# Patient Record
Sex: Male | Born: 1980 | Race: Black or African American | Hispanic: No | Marital: Married | Smoking: Former smoker
Health system: Southern US, Community
[De-identification: ages and names within clinical notes are randomized; demographics above are authoritative.]

## PROBLEM LIST (undated history)

## (undated) DIAGNOSIS — I10 Essential (primary) hypertension: Secondary | ICD-10-CM

---

## 2003-03-14 DIAGNOSIS — I10 Essential (primary) hypertension: Secondary | ICD-10-CM | POA: Insufficient documentation

## 2005-02-22 ENCOUNTER — Emergency Department (HOSPITAL_COMMUNITY): Admission: EM | Admit: 2005-02-22 | Discharge: 2005-02-22 | Payer: Self-pay | Admitting: Emergency Medicine

## 2008-08-12 ENCOUNTER — Emergency Department (HOSPITAL_COMMUNITY): Admission: EM | Admit: 2008-08-12 | Discharge: 2008-08-12 | Payer: Self-pay | Admitting: Emergency Medicine

## 2017-09-27 ENCOUNTER — Ambulatory Visit
Admission: RE | Admit: 2017-09-27 | Discharge: 2017-09-27 | Disposition: A | Discharge status: Home / Self Care | Payer: No Typology Code available for payment source | Source: Ambulatory Visit | Attending: Occupational Medicine | Admitting: Occupational Medicine

## 2017-09-27 ENCOUNTER — Other Ambulatory Visit: Payer: Self-pay | Admitting: Occupational Medicine

## 2017-09-27 DIAGNOSIS — Z021 Encounter for pre-employment examination: Secondary | ICD-10-CM

## 2018-06-14 IMAGING — CR DG CHEST 1V
1 series · 1 of 1 positions shown · non-contrast
Comparison: None.

CLINICAL DATA: 36-year-old male pre employment study.

EXAM:
CHEST 1 VIEW

[w chest pa]
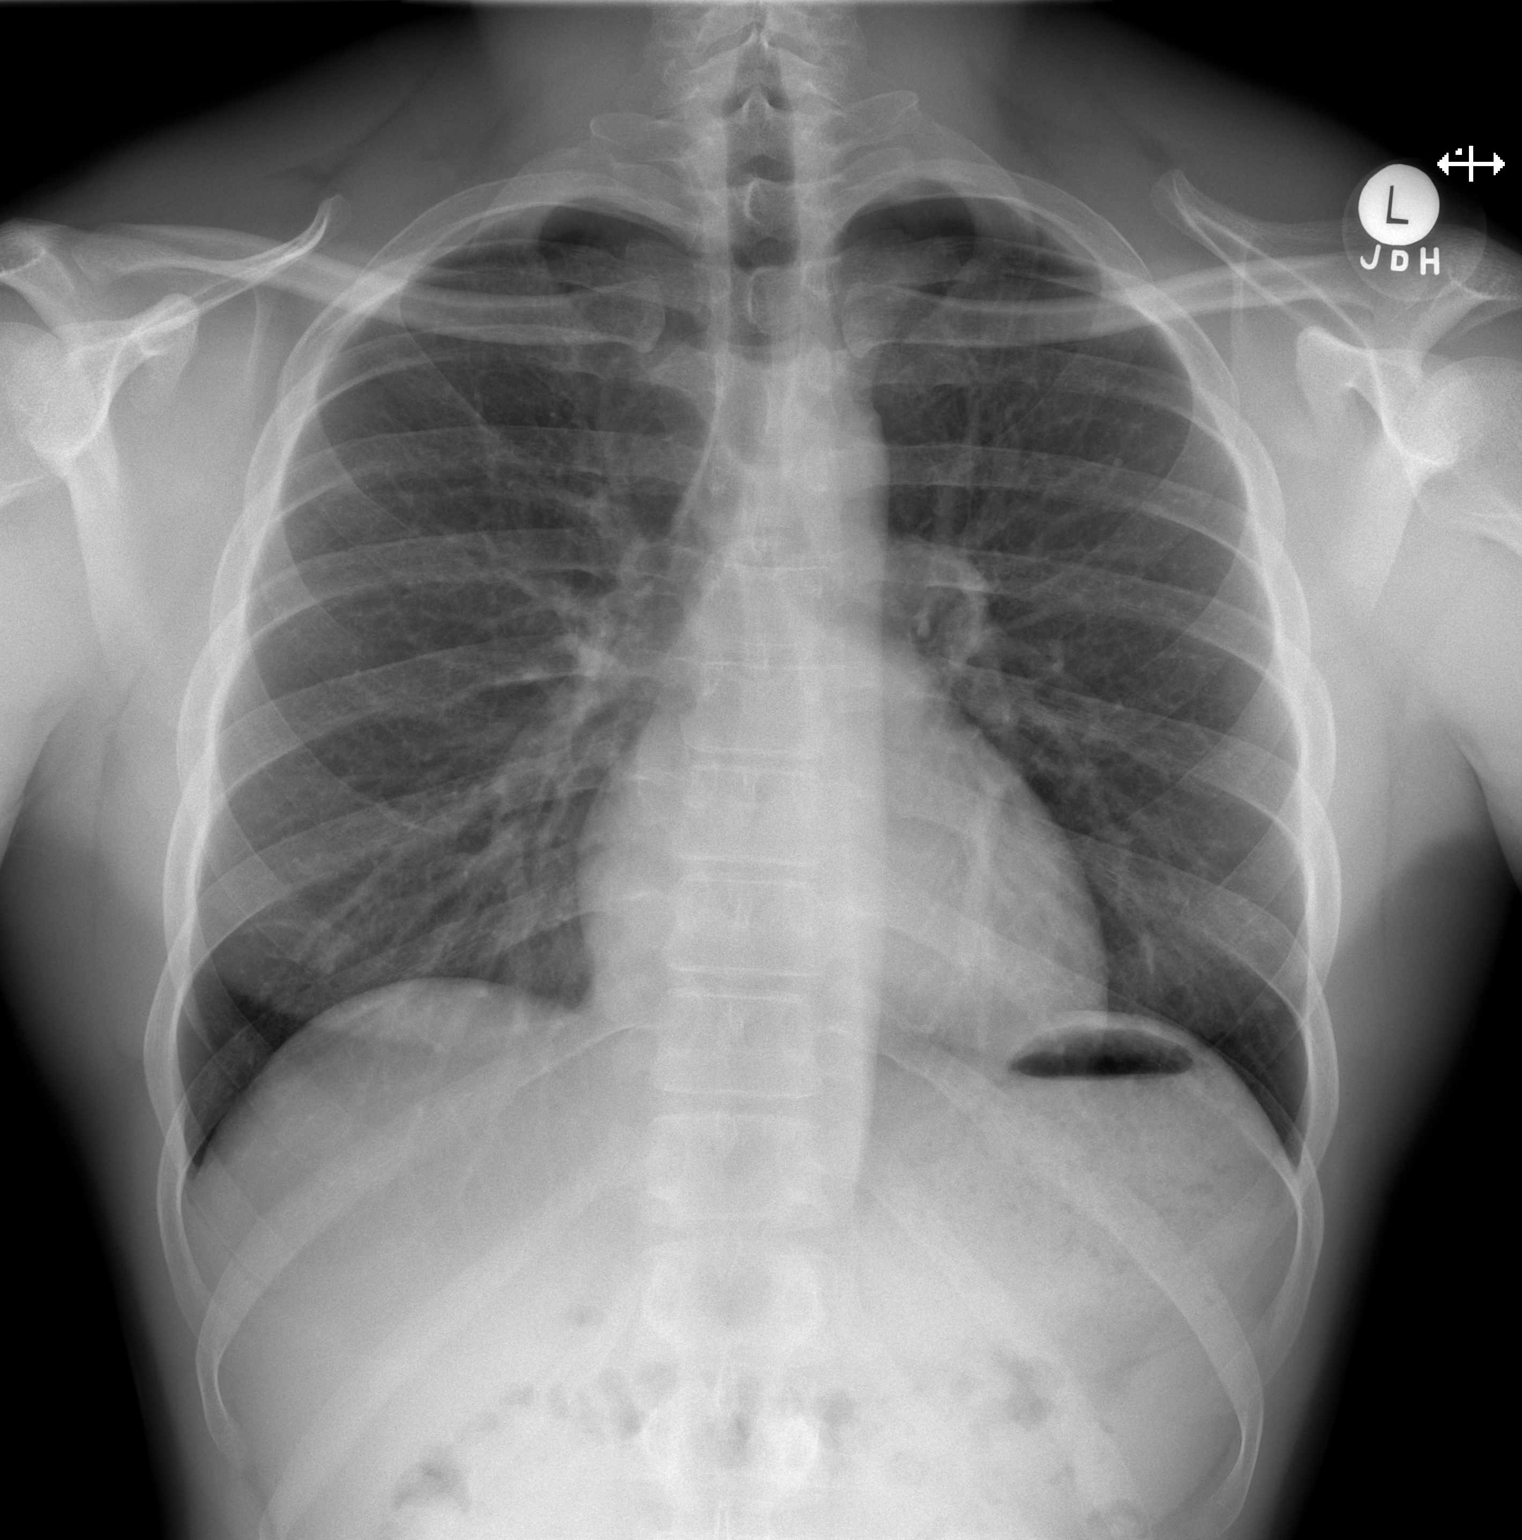

[1 of 1 positions shown; findings below may reference images not displayed]

FINDINGS: The heart size and mediastinal contours are within normal limits.
Both lungs are clear. The visualized skeletal structures are
unremarkable. Negative visible bowel gas pattern.
IMPRESSION: Negative.  No acute cardiopulmonary abnormality.

## 2019-06-05 ENCOUNTER — Emergency Department (HOSPITAL_COMMUNITY)
Admission: EM | Admit: 2019-06-05 | Discharge: 2019-06-05 | Disposition: A | Discharge status: Home / Self Care | Payer: Worker's Compensation | Attending: Emergency Medicine | Admitting: Emergency Medicine

## 2019-06-05 ENCOUNTER — Other Ambulatory Visit: Payer: Self-pay

## 2019-06-05 ENCOUNTER — Encounter (HOSPITAL_COMMUNITY): Payer: Self-pay

## 2019-06-05 DIAGNOSIS — Z5321 Procedure and treatment not carried out due to patient leaving prior to being seen by health care provider: Secondary | ICD-10-CM | POA: Insufficient documentation

## 2019-06-05 DIAGNOSIS — Z0000 Encounter for general adult medical examination without abnormal findings: Secondary | ICD-10-CM | POA: Diagnosis present

## 2019-06-05 HISTORY — DX: Essential (primary) hypertension: I10
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
## 2022-03-07 ENCOUNTER — Other Ambulatory Visit: Payer: Self-pay

## 2022-03-07 ENCOUNTER — Emergency Department (HOSPITAL_BASED_OUTPATIENT_CLINIC_OR_DEPARTMENT_OTHER)
Admission: EM | Admit: 2022-03-07 | Discharge: 2022-03-07 | Disposition: A | Discharge status: Home / Self Care | Payer: No Typology Code available for payment source | Attending: Emergency Medicine | Admitting: Emergency Medicine

## 2022-03-07 ENCOUNTER — Encounter (HOSPITAL_BASED_OUTPATIENT_CLINIC_OR_DEPARTMENT_OTHER): Payer: Self-pay

## 2022-03-07 DIAGNOSIS — W460XXA Contact with hypodermic needle, initial encounter: Secondary | ICD-10-CM

## 2022-03-07 DIAGNOSIS — W461XXA Contact with contaminated hypodermic needle, initial encounter: Secondary | ICD-10-CM | POA: Insufficient documentation

## 2022-03-07 DIAGNOSIS — Z7721 Contact with and (suspected) exposure to potentially hazardous body fluids: Secondary | ICD-10-CM | POA: Diagnosis present

## 2022-03-07 DIAGNOSIS — Z21 Asymptomatic human immunodeficiency virus [HIV] infection status: Secondary | ICD-10-CM | POA: Insufficient documentation

## 2022-03-07 LAB — HM HIV SCREENING LAB: HM HIV Screening: NEGATIVE

## 2022-03-07 LAB — RAPID HIV SCREEN (HIV 1/2 AB+AG)
HIV 1/2 Antibodies: NONREACTIVE
HIV-1 P24 Antigen - HIV24: NONREACTIVE

## 2022-03-07 MED ORDER — ELVITEG-COBIC-EMTRICIT-TENOFAF 150-150-200-10 MG PO TABS: 1.0000 | ORAL_TABLET | Freq: Every day | ORAL | 0 refills | Status: DC

## 2022-03-07 MED ORDER — ELVITEG-COBIC-EMTRICIT-TENOFAF 150-150-200-10 MG PREPACK
1.0000 | ORAL_TABLET | Freq: Once | ORAL | Status: AC
  Administered 2022-03-07: 1 via ORAL
  Filled 2022-03-07: qty 1
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
## 2022-03-07 NOTE — Discharge Instructions (Signed)
Please take the HIV medications were given to his prophylaxis as directed, beginning tomorrow, until you have completed the full course.  You should follow-up on your medical records online for the results of your HIV and hepatitis test.
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
## 2022-03-08 LAB — HEPATITIS PANEL, ACUTE
HCV Ab: NONREACTIVE
Hep A IgM: NONREACTIVE
Hep B C IgM: NONREACTIVE
Hepatitis B Surface Ag: NONREACTIVE

## 2022-03-08 LAB — HEPATITIS B SURFACE ANTIGEN: Hepatitis B Surface Ag: NONREACTIVE

## 2022-03-09 LAB — RPR: RPR Ser Ql: NONREACTIVE

## 2022-09-15 ENCOUNTER — Ambulatory Visit: Payer: 59 | Admitting: Internal Medicine

## 2022-09-15 ENCOUNTER — Encounter: Payer: Self-pay | Admitting: Internal Medicine

## 2022-09-15 VITALS — BP 136/72 | HR 103 | Temp 98.8°F | Resp 18 | Ht 72.0 in | Wt 187.8 lb

## 2022-09-15 DIAGNOSIS — Z0000 Encounter for general adult medical examination without abnormal findings: Secondary | ICD-10-CM

## 2022-09-15 DIAGNOSIS — R7303 Prediabetes: Secondary | ICD-10-CM | POA: Diagnosis not present

## 2022-09-15 DIAGNOSIS — Z13220 Encounter for screening for lipoid disorders: Secondary | ICD-10-CM

## 2022-09-15 NOTE — Patient Instructions (Signed)
It was great seeing you today!  Plan discussed at today's visit: -Blood work ordered today, results will be uploaded to MyChart.   Follow up in: 1 year or sooner as needed  Take care and let us know if you have any questions or concerns prior to your next visit.  Dr. Aryan Sparks  

## 2022-09-15 NOTE — Progress Notes (Signed)
New Patient Office Visit  Subjective    Patient ID: Nathan Meyers, male    DOB: Apr 09, 1981  Age: 41 y.o. MRN: [id number]  CC:  Chief Complaint  Patient presents with   Establish Care    HPI HERCHEL HOPKIN presents to establish care. He is also seen through the [place].   Hypertension: -Medications: Lisinopril-HCTZ 20-25 mg, been on for several years, managed by [place].  -Patient is compliant with above medications and reports no side effects. -Checking BP at home (average): 114/80 at home  -Denies any SOB, CP, vision changes, LE edema or symptoms of hypotension  Pre-Diabetes  -A1c in 2021 5.7%, 8/22 6.0% -Never been on medication but took a health and nutrition class and lost 10-15 pounds since his last A1c  Health Maintenance: -Blood work due -Politely declines flu vaccine  Outpatient Encounter Medications as of 09/15/2022  Medication Sig   lisinopril-hydrochlorothiazide (ZESTORETIC) 20-25 MG tablet Take 1 tablet by mouth daily.   [DISCONTINUED] elvitegravir-cobicistat-emtricitabine-tenofovir (GENVOYA) 150-150-200-10 MG TABS tablet Take 1 tablet by mouth daily with breakfast.   [DISCONTINUED] elvitegravir-cobicistat-emtricitabine-tenofovir (GENVOYA) 150-150-200-10 MG TABS tablet Take 1 tablet by mouth daily with breakfast.   No facility-administered encounter medications on file as of 09/15/2022.    Past Medical History:  Diagnosis Date   Hypertension     History reviewed. No pertinent surgical history.  Family History  Problem Relation Age of Onset   Diabetes Mother    Hypertension Mother    Heart disease Father    Hypertension Father     Social History   Socioeconomic History   Marital status: Married    Spouse name: Not on file   Number of children: Not on file   Years of education: Not on file   Highest education level: Not on file  Occupational History   Not on file  Tobacco Use   Smoking status: Former    Packs/day: 0.50    Years: 3.00    Total  pack years: 1.50    Types: Cigarettes    Quit date: 01/25/2014    Years since quitting: 8.6   Smokeless tobacco: Never  Vaping Use   Vaping Use: Never used  Substance and Sexual Activity   Alcohol use: Yes    Alcohol/week: 4.0 standard drinks of alcohol    Types: 4 Cans of beer per week   Drug use: Never   Sexual activity: Yes    Birth control/protection: None  Other Topics Concern   Not on file  Social History Narrative   Not on file   Social Determinants of Health   Financial Resource Strain: Not on file  Food Insecurity: Not on file  Transportation Needs: Not on file  Physical Activity: Not on file  Stress: Not on file  Social Connections: Not on file  Intimate Partner Violence: Not on file    Review of Systems  Constitutional:  Negative for chills and fever.  Eyes:  Negative for blurred vision.  Respiratory:  Negative for shortness of breath.   Cardiovascular:  Negative for chest pain.  Neurological:  Negative for headaches.      Objective    BP 136/72   Pulse (!) 103   Temp 98.8 F (37.1 C)   Resp 18   Ht 6' (1.829 m)   Wt 187 lb 12.8 oz (85.2 kg)   SpO2 96%   BMI 25.47 kg/m   Physical Exam Constitutional:      Appearance: Normal appearance.  HENT:  Head: Normocephalic and atraumatic.     Mouth/Throat:     Mouth: Mucous membranes are moist.     Pharynx: Oropharynx is clear.  Eyes:     Extraocular Movements: Extraocular movements intact.     Conjunctiva/sclera: Conjunctivae normal.     Pupils: Pupils are equal, round, and reactive to light.  Cardiovascular:     Rate and Rhythm: Normal rate and regular rhythm.  Pulmonary:     Effort: Pulmonary effort is normal.     Breath sounds: Normal breath sounds.  Musculoskeletal:     Right lower leg: No edema.     Left lower leg: No edema.  Lymphadenopathy:     Cervical: No cervical adenopathy.  Skin:    General: Skin is warm and dry.  Neurological:     General: No focal deficit present.      Mental Status: He is alert. Mental status is at baseline.  Psychiatric:        Mood and Affect: Mood normal.        Behavior: Behavior normal.         Assessment & Plan:   1. Annual physical exam/Lipid screening/Prediabetes: Due for annual labs. Follow up in 1 year or sooner as needed.   - CBC w/Diff/Platelet - COMPLETE METABOLIC PANEL WITH GFR - HgB R6E - Lipid Profile   Return in about 1 year (around 09/16/2023).   Margarita Mail, DO

## 2022-09-16 LAB — CBC WITH DIFFERENTIAL/PLATELET
Absolute Monocytes: 440 cells/uL (ref 200–950)
Basophils Absolute: 40 cells/uL (ref 0–200)
Basophils Relative: 1 %
Eosinophils Absolute: 268 cells/uL (ref 15–500)
Eosinophils Relative: 6.7 %
HCT: 41.6 % (ref 38.5–50.0)
Hemoglobin: 14.4 g/dL (ref 13.2–17.1)
Lymphs Abs: 1388 cells/uL (ref 850–3900)
MCH: 30.8 pg (ref 27.0–33.0)
MCHC: 34.6 g/dL (ref 32.0–36.0)
MCV: 89.1 fL (ref 80.0–100.0)
MPV: 10.5 fL (ref 7.5–12.5)
Monocytes Relative: 11 %
Neutro Abs: 1864 cells/uL (ref 1500–7800)
Neutrophils Relative %: 46.6 %
Platelets: 216 10*3/uL (ref 140–400)
RBC: 4.67 10*6/uL (ref 4.20–5.80)
RDW: 12.6 % (ref 11.0–15.0)
Total Lymphocyte: 34.7 %
WBC: 4 10*3/uL (ref 3.8–10.8)

## 2022-09-16 LAB — HEMOGLOBIN A1C
Hgb A1c MFr Bld: 5.7 % of total Hgb — ABNORMAL HIGH (ref <5.7)
Mean Plasma Glucose: 117 mg/dL
eAG (mmol/L): 6.5 mmol/L

## 2022-09-16 LAB — LIPID PANEL
Cholesterol: 244 mg/dL — ABNORMAL HIGH (ref <200)
HDL: 60 mg/dL (ref >=40)
LDL Cholesterol (Calc): 163 mg/dL (calc) — ABNORMAL HIGH
Non-HDL Cholesterol (Calc): 184 mg/dL (calc) — ABNORMAL HIGH (ref <130)
Total CHOL/HDL Ratio: 4.1 (calc) (ref <5.0)
Triglycerides: 100 mg/dL (ref <150)

## 2022-09-16 LAB — COMPLETE METABOLIC PANEL WITH GFR
AG Ratio: 1.6 (calc) (ref 1.0–2.5)
ALT: 20 U/L (ref 9–46)
AST: 18 U/L (ref 10–40)
Albumin: 4.5 g/dL (ref 3.6–5.1)
Alkaline phosphatase (APISO): 43 U/L (ref 36–130)
BUN/Creatinine Ratio: 12 (calc) (ref 6–22)
BUN: 16 mg/dL (ref 7–25)
CO2: 27 mmol/L (ref 20–32)
Calcium: 9.7 mg/dL (ref 8.6–10.3)
Chloride: 102 mmol/L (ref 98–110)
Creat: 1.38 mg/dL — ABNORMAL HIGH (ref 0.60–1.29)
Globulin: 2.9 g/dL (calc) (ref 1.9–3.7)
Glucose, Bld: 98 mg/dL (ref 65–99)
Potassium: 4.5 mmol/L (ref 3.5–5.3)
Sodium: 139 mmol/L (ref 135–146)
Total Bilirubin: 1.1 mg/dL (ref 0.2–1.2)
Total Protein: 7.4 g/dL (ref 6.1–8.1)
eGFR: 66 mL/min/{1.73_m2} (ref >=60)

## 2023-09-17 NOTE — Progress Notes (Unsigned)
Established Patient Office Visit  Subjective    Patient ID: Nathan Meyers, male    DOB: 1980-10-25  Age: 42 y.o. MRN: [id number]  CC:  No chief complaint on file.   HPI Nathan Meyers presents for annual physical and follow up. He is also seen through the [place].   Hypertension: -Medications: Lisinopril-HCTZ 20-25 mg, been on for several years, managed by [place].  -Patient is compliant with above medications and reports no side effects. -Checking BP at home (average): 114/80 at home  -Denies any SOB, CP, vision changes, LE edema or symptoms of hypotension  Pre-Diabetes  -A1c 5.7% 11/23 -Never been on medication but took a health and nutrition class and lost 10-15 pounds since his last A1c  Health Maintenance: -Blood work due -Politely declines flu vaccine  IPSS Questionnaire (AUA-7): Over the past month.   1)  How often have you had a sensation of not emptying your bladder completely after you finish urinating?  {Rating:19227}  2)  How often have you had to urinate again less than two hours after you finished urinating? {Rating:19227}  3)  How often have you found you stopped and started again several times when you urinated?  {Rating:19227}  4) How difficult have you found it to postpone urination?  {Rating:19227}  5) How often have you had a weak urinary stream?  {Rating:19227}  6) How often have you had to push or strain to begin urination?  {Rating:19227}  7) How many times did you most typically get up to urinate from the time you went to bed until the time you got up in the morning?  {Rating:19228}  Total score:  0-7 mildly symptomatic   8-19 moderately symptomatic   20-35 severely symptomatic     Diet: *** Exercise: *** Last Dental Exam: **** Last Eye Exam: ***  Depression: phq 9 is {gen pos [id number]}    09/15/2022   11:00 AM  Depression screen PHQ 2/9  Decreased Interest 0  Down, Depressed, Hopeless 0  PHQ - 2 Score 0  Altered sleeping 0  Tired, decreased  energy 0  Change in appetite 0  Feeling bad or failure about yourself  0  Trouble concentrating 0  Moving slowly or fidgety/restless 0  Suicidal thoughts 0  PHQ-9 Score 0  Difficult doing work/chores Not difficult at all    STD testing and prevention (HIV/chl/gon/syphilis): already completed  Hep C Screening: UTD Skin cancer: Discussed monitoring for atypical lesions Colorectal cancer: *** Prostate cancer: n/a   Lung cancer:  Low Dose CT Chest recommended if Age 60-80 years, 30 pack-year currently smoking OR have quit w/in 15years. Patient  {Response; is/is not/na:63} a candidate for screening   AAA: The USPSTF recommends one-time screening with ultrasonography in men ages 66 to 75 years who have ever smoked. Patient   {Response; is /is not/na:63}, a candidate for screening  ECG:  ***  Vaccines:   HPV:  Tdap:  Shingrix:  Pneumonia:  Flu:  COVID-19:  Advanced Care Planning: A voluntary discussion about advance care planning including the explanation and discussion of advance directives.  Discussed health care proxy and Living will, and the patient was able to identify a health care proxy as ***.  Patient {DOES_DOES MWU:13244} have a living will and power of attorney of health care      Outpatient Encounter Medications as of 09/18/2023  Medication Sig   lisinopril-hydrochlorothiazide (ZESTORETIC) 20-25 MG tablet Take 1 tablet by mouth daily.   No facility-administered encounter medications  on file as of 09/18/2023.    Past Medical History:  Diagnosis Date   Hypertension     No past surgical history on file.  Family History  Problem Relation Age of Onset   Diabetes Mother    Hypertension Mother    Heart disease Father    Hypertension Father     Social History   Socioeconomic History   Marital status: Married    Spouse name: Not on file   Number of children: Not on file   Years of education: Not on file   Highest education level: Not on file  Occupational  History   Not on file  Tobacco Use   Smoking status: Former    Current packs/day: 0.00    Average packs/day: 0.5 packs/day for 3.0 years (1.5 ttl pk-yrs)    Types: Cigarettes    Start date: 01/26/2011    Quit date: 01/25/2014    Years since quitting: 9.6   Smokeless tobacco: Never  Vaping Use   Vaping status: Never Used  Substance and Sexual Activity   Alcohol use: Yes    Alcohol/week: 4.0 standard drinks of alcohol    Types: 4 Cans of beer per week   Drug use: Never   Sexual activity: Yes    Birth control/protection: None  Other Topics Concern   Not on file  Social History Narrative   Not on file   Social Determinants of Health   Financial Resource Strain: Not on file  Food Insecurity: Not on file  Transportation Needs: Not on file  Physical Activity: Not on file  Stress: Not on file  Social Connections: Not on file  Intimate Partner Violence: Not on file    Review of Systems  Constitutional:  Negative for chills and fever.  Eyes:  Negative for blurred vision.  Respiratory:  Negative for shortness of breath.   Cardiovascular:  Negative for chest pain.  Neurological:  Negative for headaches.      Objective    There were no vitals taken for this visit.  Physical Exam Constitutional:      Appearance: Normal appearance.  HENT:     Head: Normocephalic and atraumatic.     Mouth/Throat:     Mouth: Mucous membranes are moist.     Pharynx: Oropharynx is clear.  Eyes:     Extraocular Movements: Extraocular movements intact.     Conjunctiva/sclera: Conjunctivae normal.     Pupils: Pupils are equal, round, and reactive to light.  Cardiovascular:     Rate and Rhythm: Normal rate and regular rhythm.  Pulmonary:     Effort: Pulmonary effort is normal.     Breath sounds: Normal breath sounds.  Musculoskeletal:     Right lower leg: No edema.     Left lower leg: No edema.  Lymphadenopathy:     Cervical: No cervical adenopathy.  Skin:    General: Skin is warm and  dry.  Neurological:     General: No focal deficit present.     Mental Status: He is alert. Mental status is at baseline.  Psychiatric:        Mood and Affect: Mood normal.        Behavior: Behavior normal.         Assessment & Plan:   1. Annual physical exam/Lipid screening/Prediabetes: Due for annual labs. Follow up in 1 year or sooner as needed.   - CBC w/Diff/Platelet - COMPLETE METABOLIC PANEL WITH GFR - HgB Q0H - Lipid Profile  No follow-ups on file.   Margarita Mail, DO

## 2023-09-18 ENCOUNTER — Other Ambulatory Visit: Payer: Self-pay

## 2023-09-18 ENCOUNTER — Encounter: Payer: Self-pay | Admitting: Internal Medicine

## 2023-09-18 ENCOUNTER — Ambulatory Visit (INDEPENDENT_AMBULATORY_CARE_PROVIDER_SITE_OTHER): Payer: 59 | Admitting: Internal Medicine

## 2023-09-18 VITALS — BP 124/82 | HR 81 | Temp 98.0°F | Resp 16 | Ht 72.0 in | Wt 194.5 lb

## 2023-09-18 DIAGNOSIS — Z0001 Encounter for general adult medical examination with abnormal findings: Secondary | ICD-10-CM | POA: Diagnosis not present

## 2023-09-18 DIAGNOSIS — Z23 Encounter for immunization: Secondary | ICD-10-CM | POA: Diagnosis not present

## 2023-09-18 DIAGNOSIS — R7303 Prediabetes: Secondary | ICD-10-CM

## 2023-09-18 DIAGNOSIS — Z0000 Encounter for general adult medical examination without abnormal findings: Secondary | ICD-10-CM

## 2023-09-18 DIAGNOSIS — Z13220 Encounter for screening for lipoid disorders: Secondary | ICD-10-CM

## 2023-09-19 LAB — CBC WITH DIFFERENTIAL/PLATELET
Absolute Lymphocytes: 1555 {cells}/uL (ref 850–3900)
Absolute Monocytes: 490 {cells}/uL (ref 200–950)
Basophils Absolute: 40 {cells}/uL (ref 0–200)
Basophils Relative: 1.1 %
Eosinophils Absolute: 162 {cells}/uL (ref 15–500)
Eosinophils Relative: 4.5 %
HCT: 44.9 % (ref 38.5–50.0)
Hemoglobin: 14.6 g/dL (ref 13.2–17.1)
MCH: 29.6 pg (ref 27.0–33.0)
MCHC: 32.5 g/dL (ref 32.0–36.0)
MCV: 90.9 fL (ref 80.0–100.0)
MPV: 10.5 fL (ref 7.5–12.5)
Monocytes Relative: 13.6 %
Neutro Abs: 1354 {cells}/uL — ABNORMAL LOW (ref 1500–7800)
Neutrophils Relative %: 37.6 %
Platelets: 208 10*3/uL (ref 140–400)
RBC: 4.94 10*6/uL (ref 4.20–5.80)
RDW: 12.3 % (ref 11.0–15.0)
Total Lymphocyte: 43.2 %
WBC: 3.6 10*3/uL — ABNORMAL LOW (ref 3.8–10.8)

## 2023-09-19 LAB — COMPLETE METABOLIC PANEL WITH GFR
AG Ratio: 1.7 (calc) (ref 1.0–2.5)
ALT: 22 U/L (ref 9–46)
AST: 19 U/L (ref 10–40)
Albumin: 4.6 g/dL (ref 3.6–5.1)
Alkaline phosphatase (APISO): 42 U/L (ref 36–130)
BUN/Creatinine Ratio: 17 (calc) (ref 6–22)
BUN: 24 mg/dL (ref 7–25)
CO2: 32 mmol/L (ref 20–32)
Calcium: 9.6 mg/dL (ref 8.6–10.3)
Chloride: 98 mmol/L (ref 98–110)
Creat: 1.43 mg/dL — ABNORMAL HIGH (ref 0.60–1.29)
Globulin: 2.7 g/dL (ref 1.9–3.7)
Glucose, Bld: 94 mg/dL (ref 65–99)
Potassium: 4.3 mmol/L (ref 3.5–5.3)
Sodium: 137 mmol/L (ref 135–146)
Total Bilirubin: 0.9 mg/dL (ref 0.2–1.2)
Total Protein: 7.3 g/dL (ref 6.1–8.1)
eGFR: 63 mL/min/{1.73_m2} (ref >=60)

## 2023-09-19 LAB — LIPID PANEL
Cholesterol: 246 mg/dL — ABNORMAL HIGH (ref <200)
HDL: 50 mg/dL (ref >=40)
LDL Cholesterol (Calc): 170 mg/dL — ABNORMAL HIGH
Non-HDL Cholesterol (Calc): 196 mg/dL — ABNORMAL HIGH (ref <130)
Total CHOL/HDL Ratio: 4.9 (calc) (ref <5.0)
Triglycerides: 123 mg/dL (ref <150)

## 2023-09-19 LAB — HEMOGLOBIN A1C
Hgb A1c MFr Bld: 5.8 %{Hb} — ABNORMAL HIGH (ref <5.7)
Mean Plasma Glucose: 120 mg/dL
eAG (mmol/L): 6.6 mmol/L

## 2023-12-14 ENCOUNTER — Ambulatory Visit
Admission: EM | Admit: 2023-12-14 | Discharge: 2023-12-14 | Disposition: A | Discharge status: Home / Self Care | Payer: No Typology Code available for payment source | Attending: Emergency Medicine | Admitting: Emergency Medicine

## 2023-12-14 ENCOUNTER — Ambulatory Visit (INDEPENDENT_AMBULATORY_CARE_PROVIDER_SITE_OTHER): Payer: No Typology Code available for payment source

## 2023-12-14 DIAGNOSIS — M545 Low back pain, unspecified: Secondary | ICD-10-CM

## 2023-12-14 MED ORDER — METHOCARBAMOL 500 MG PO TABS: 500.0000 mg | ORAL_TABLET | Freq: Two times a day (BID) | ORAL | 0 refills | Status: AC | PRN

## 2023-12-14 NOTE — Discharge Instructions (Addendum)
 The xray is pending.   Take ibuprofen as needed for discomfort.    Take the muscle relaxer as needed for muscle spasm; Do not drive, operate machinery, or drink alcohol with this medication as it can cause drowsiness.   Follow up with your primary care provider or an orthopedist if your symptoms are not improving.

## 2023-12-14 NOTE — ED Triage Notes (Signed)
 Pt states he was lifting weights on Tuesday and felt a pop in his lower back. Now having pain when moving. Pt states he went to a chiropractor and felt better but still having pain with movement. Taking aleve.

## 2023-12-14 NOTE — ED Provider Notes (Signed)
 Renaldo Fiddler    CSN: [id number] Arrival date & time: 12/14/23  1341      History   Chief Complaint Chief Complaint  Patient presents with   Back Pain    HPI Nathan Meyers is a 43 y.o. male.  Patient presents with 2-day history of low back pain.  His symptoms started while he was lifting weights; he felt a pop.  The pain is worse with movement and position changes.  Treatment attempted with Aleve, ibuprofen, Tylenol.  He denies numbness, weakness, paresthesias, saddle anesthesia, loss of bowel/bladder control, abdominal pain, dysuria, hematuria.  His medical history includes hypertension and prediabetes.  The history is provided by the patient and medical records.    Past Medical History:  Diagnosis Date   Hypertension     Patient Active Problem List   Diagnosis Date Noted   Prediabetes 09/15/2022   High blood pressure 03/14/2003    History reviewed. No pertinent surgical history.     Home Medications    Prior to Admission medications   Medication Sig Start Date End Date Taking? Authorizing Provider  lisinopril-hydrochlorothiazide (ZESTORETIC) 20-25 MG tablet Take 1 tablet by mouth daily. 03/01/22  Yes [provider]  methocarbamol (ROBAXIN) 500 MG tablet Take 1 tablet (500 mg total) by mouth 2 (two) times daily as needed for muscle spasms. 12/14/23  Yes Mickie Bail, NP    Family History Family History  Problem Relation Age of Onset   Diabetes Mother    Hypertension Mother    Heart disease Father    Hypertension Father     Social History Social History   Tobacco Use   Smoking status: Former    Current packs/day: 0.00    Average packs/day: 0.5 packs/day for 3.0 years (1.5 ttl pk-yrs)    Types: Cigarettes    Start date: 01/26/2011    Quit date: 01/25/2014    Years since quitting: 9.8   Smokeless tobacco: Never  Vaping Use   Vaping status: Never Used  Substance Use Topics   Alcohol use: Yes    Alcohol/week: 4.0 standard drinks of  alcohol    Types: 4 Cans of beer per week   Drug use: Never     Allergies   Patient has no known allergies.   Review of Systems Review of Systems  Constitutional:  Negative for chills and fever.  Gastrointestinal:  Negative for abdominal pain, diarrhea, nausea and vomiting.  Genitourinary:  Negative for dysuria and hematuria.  Musculoskeletal:  Positive for back pain. Negative for gait problem.  Skin:  Negative for color change, rash and wound.  Neurological:  Negative for weakness and numbness.     Physical Exam Triage Vital Signs ED Triage Vitals [12/14/23 1425]  Encounter Vitals Group     BP 133/86     Systolic BP Percentile      Diastolic BP Percentile      Pulse Rate 77     Resp 16     Temp 98.7 F (37.1 C)     Temp Source Oral     SpO2 98 %     Weight      Height      Head Circumference      Peak Flow      Pain Score 7     Pain Loc      Pain Education      Exclude from Growth Chart    No data found.  Updated Vital Signs BP 133/86 (BP Location:  Left Arm)   Pulse 77   Temp 98.7 F (37.1 C) (Oral)   Resp 16   SpO2 98%   Visual Acuity Right Eye Distance:   Left Eye Distance:   Bilateral Distance:    Right Eye Near:   Left Eye Near:    Bilateral Near:     Physical Exam Constitutional:      General: He is not in acute distress. HENT:     Mouth/Throat:     Mouth: Mucous membranes are moist.  Cardiovascular:     Rate and Rhythm: Normal rate and regular rhythm.  Pulmonary:     Effort: Pulmonary effort is normal. No respiratory distress.  Abdominal:     General: Bowel sounds are normal.     Palpations: Abdomen is soft.     Tenderness: There is no abdominal tenderness. There is no right CVA tenderness, left CVA tenderness, guarding or rebound.  Musculoskeletal:        General: No swelling, tenderness or deformity. Normal range of motion.       Back:  Skin:    Capillary Refill: Capillary refill takes less than 2 seconds.     Findings: No  bruising, erythema, lesion or rash.  Neurological:     General: No focal deficit present.     Mental Status: He is alert and oriented to person, place, and time.     Sensory: No sensory deficit.     Motor: No weakness.     Gait: Gait normal.      UC Treatments / Results  Labs (all labs ordered are listed, but only abnormal results are displayed) Labs Reviewed - No data to display  EKG   Radiology DG Lumbar Spine Complete Result Date: 12/14/2023 CLINICAL DATA:  pain x 2 days.  lifting weights and heard a pop. EXAM: LUMBAR SPINE - COMPLETE 4+ VIEW COMPARISON:  None Available. FINDINGS: Five non-rib-bearing lumbar vertebra. Normal lumbar alignment. Normal vertebral body heights. No fracture or compression deformity. The disc spaces are preserved. No evidence of pars defects or focal bone abnormality. Sacroiliac joints are congruent. IMPRESSION: Negative radiographs of the lumbar spine. Electronically Signed   By: Narda Rutherford M.D.   On: 12/14/2023 17:09    Procedures Procedures (including critical care time)  Medications Ordered in UC Medications - No data to display  Initial Impression / Assessment and Plan / UC Course  I have reviewed the triage vital signs and the nursing notes.  Pertinent labs & imaging results that were available during my care of the patient were reviewed by me and considered in my medical decision making (see chart for details).    Acute midline low back pain without sciatica. X-ray negative.  Instructed patient to continue Tylenol or ibuprofen as needed for discomfort.  Treating today with methocarbamol; precautions for drowsiness with this medication discussed.  Education provided on acute back pain.  Instructed patient to follow-up with his PCP or an orthopedist if his symptoms are not improving.  Contact information for on-call Ortho provided.  ED precautions given.  Patient agrees to plan of care.  Final Clinical Impressions(s) / UC Diagnoses    Final diagnoses:  Acute midline low back pain without sciatica     Discharge Instructions      The xray is pending.   Take ibuprofen as needed for discomfort.    Take the muscle relaxer as needed for muscle spasm; Do not drive, operate machinery, or drink alcohol with this medication as it can cause  drowsiness.   Follow up with your primary care provider or an orthopedist if your symptoms are not improving.         ED Prescriptions     Medication Sig Dispense Auth. Provider   methocarbamol (ROBAXIN) 500 MG tablet Take 1 tablet (500 mg total) by mouth 2 (two) times daily as needed for muscle spasms. 10 tablet Mickie Bail, NP      I have reviewed the PDMP during this encounter.   Mickie Bail, NP 12/14/23 1714

## 2023-12-15 ENCOUNTER — Ambulatory Visit: Payer: Self-pay

## 2024-03-10 ENCOUNTER — Ambulatory Visit
Admission: RE | Admit: 2024-03-10 | Discharge: 2024-03-10 | Discharge status: Home / Self Care | Source: Ambulatory Visit | Attending: Internal Medicine | Admitting: Internal Medicine

## 2024-03-10 ENCOUNTER — Other Ambulatory Visit: Payer: Self-pay

## 2024-03-10 VITALS — BP 118/67 | HR 72 | Temp 98.2°F

## 2024-03-10 DIAGNOSIS — B349 Viral infection, unspecified: Secondary | ICD-10-CM

## 2024-03-10 NOTE — ED Triage Notes (Signed)
 PT continues to have a fever . Pt reports fever started on Monday . Pt took OTC and felt better on WED. As pt became more active his fever returned on Friday.

## 2024-03-10 NOTE — Discharge Instructions (Addendum)
 Declined avs

## 2024-03-10 NOTE — ED Provider Notes (Signed)
 [facility]    CSN: [id number] Arrival date & time: 03/10/24  1202      History   Chief Complaint Chief Complaint  Patient presents with   Fever    Fever stared Monday ended Wednesday but started running a fever again yesterday . - Entered by patient    HPI ELDOR CONAWAY is a 43 y.o. male.   Patient presents for evaluation of fever, nasal congestion, nonproductive cough and a sore throat present for 5 days.  If fever occurred on Monday and Tuesday and then resolved before returning on Friday, took Tylenol Cold and flu for management in fever has resolved.  Known sick contact in household.  Tolerating food and liquids.  Denies shortness of breath, wheezing.  Past Medical History:  Diagnosis Date   Hypertension     Patient Active Problem List   Diagnosis Date Noted   Prediabetes 09/15/2022   High blood pressure 03/14/2003    History reviewed. No pertinent surgical history.     Home Medications    Prior to Admission medications   Medication Sig Start Date End Date Taking? Authorizing Provider  lisinopril-hydrochlorothiazide (ZESTORETIC) 20-25 MG tablet Take 1 tablet by mouth daily. 03/01/22   [provider]  methocarbamol  (ROBAXIN ) 500 MG tablet Take 1 tablet (500 mg total) by mouth 2 (two) times daily as needed for muscle spasms. 12/14/23   Wellington Half, NP    Family History Family History  Problem Relation Age of Onset   Diabetes Mother    Hypertension Mother    Heart disease Father    Hypertension Father     Social History Social History   Tobacco Use   Smoking status: Former    Current packs/day: 0.00    Average packs/day: 0.5 packs/day for 3.0 years (1.5 ttl pk-yrs)    Types: Cigarettes    Start date: 01/26/2011    Quit date: 01/25/2014    Years since quitting: 10.1   Smokeless tobacco: Never  Vaping Use   Vaping status: Never Used  Substance Use Topics   Alcohol use: Yes    Alcohol/week: 4.0 standard drinks of alcohol     Types: 4 Cans of beer per week   Drug use: Never     Allergies   Patient has no known allergies.   Review of Systems Review of Systems   Physical Exam Triage Vital Signs ED Triage Vitals  Encounter Vitals Group     BP 03/10/24 1220 118/67     Systolic BP Percentile --      Diastolic BP Percentile --      Pulse Rate 03/10/24 1220 72     Resp --      Temp 03/10/24 1220 98.2 F (36.8 C)     Temp src --      SpO2 03/10/24 1220 98 %     Weight --      Height --      Head Circumference --      Peak Flow --      Pain Score 03/10/24 1222 0     Pain Loc --      Pain Education --      Exclude from Growth Chart --    No data found.  Updated Vital Signs BP 118/67   Pulse 72   Temp 98.2 F (36.8 C)   SpO2 98%   Visual Acuity Right Eye Distance:   Left Eye Distance:   Bilateral Distance:    Right Eye  Near:   Left Eye Near:    Bilateral Near:     Physical Exam Constitutional:      Appearance: Normal appearance.  HENT:     Head: Normocephalic.     Right Ear: Tympanic membrane, ear canal and external ear normal.     Left Ear: Tympanic membrane, ear canal and external ear normal.     Nose: Nose normal.     Mouth/Throat:     Pharynx: Posterior oropharyngeal erythema present. No oropharyngeal exudate.  Eyes:     Extraocular Movements: Extraocular movements intact.  Cardiovascular:     Rate and Rhythm: Normal rate and regular rhythm.     Pulses: Normal pulses.     Heart sounds: Normal heart sounds.  Pulmonary:     Effort: Pulmonary effort is normal.     Breath sounds: Normal breath sounds.  Musculoskeletal:     Cervical back: Normal range of motion and neck supple.  Neurological:     Mental Status: He is alert and oriented to person, place, and time. Mental status is at baseline.      UC Treatments / Results  Labs (all labs ordered are listed, but only abnormal results are displayed) Labs Reviewed - No data to display  EKG   Radiology No results  found.  Procedures Procedures (including critical care time)  Medications Ordered in UC Medications - No data to display  Initial Impression / Assessment and Plan / UC Course  I have reviewed the triage vital signs and the nursing notes.  Pertinent labs & imaging results that were available during my care of the patient were reviewed by me and considered in my medical decision making (see chart for details).  Viral Illness  Patient is in no signs of distress nor toxic appearing.  Vital signs are stable.  Low suspicion for pneumonia, pneumothorax or bronchitis and therefore will defer imaging.May use additional over-the-counter medications as needed for supportive care.  May follow-up with urgent care as needed if symptoms persist or worsen.   Final Clinical Impressions(s) / UC Diagnoses   Final diagnoses:  Viral illness   Discharge Instructions      Declined avs  ED Prescriptions   None    PDMP not reviewed this encounter.   Reena Canning, NP 03/10/24 1255

## 2024-09-18 ENCOUNTER — Other Ambulatory Visit: Payer: Self-pay

## 2024-09-18 ENCOUNTER — Ambulatory Visit: Payer: Self-pay | Admitting: Internal Medicine

## 2024-09-18 ENCOUNTER — Encounter: Payer: Self-pay | Admitting: Internal Medicine

## 2024-09-18 VITALS — BP 124/72 | HR 67 | Temp 98.2°F | Resp 16 | Ht 72.0 in | Wt 188.8 lb

## 2024-09-18 DIAGNOSIS — R7303 Prediabetes: Secondary | ICD-10-CM

## 2024-09-18 DIAGNOSIS — E7800 Pure hypercholesterolemia, unspecified: Secondary | ICD-10-CM | POA: Diagnosis not present

## 2024-09-18 DIAGNOSIS — Z0000 Encounter for general adult medical examination without abnormal findings: Secondary | ICD-10-CM

## 2024-09-18 DIAGNOSIS — Z0001 Encounter for general adult medical examination with abnormal findings: Secondary | ICD-10-CM | POA: Diagnosis not present

## 2024-09-18 DIAGNOSIS — I10 Essential (primary) hypertension: Secondary | ICD-10-CM

## 2024-09-18 LAB — COMPREHENSIVE METABOLIC PANEL WITH GFR
AG Ratio: 1.8 (calc) (ref 1.0–2.5)
ALT: 19 U/L (ref 9–46)
AST: 20 U/L (ref 10–40)
Albumin: 4.1 g/dL (ref 3.6–5.1)
Alkaline phosphatase (APISO): 37 U/L (ref 36–130)
BUN: 22 mg/dL (ref 7–25)
CO2: 30 mmol/L (ref 20–32)
Calcium: 8.8 mg/dL (ref 8.6–10.3)
Chloride: 100 mmol/L (ref 98–110)
Creat: 1.27 mg/dL (ref 0.60–1.29)
Globulin: 2.3 g/dL (ref 1.9–3.7)
Glucose, Bld: 96 mg/dL (ref 65–99)
Potassium: 4.3 mmol/L (ref 3.5–5.3)
Sodium: 138 mmol/L (ref 135–146)
Total Bilirubin: 1.3 mg/dL — ABNORMAL HIGH (ref 0.2–1.2)
Total Protein: 6.4 g/dL (ref 6.1–8.1)
eGFR: 72 mL/min/1.73m2 (ref >=60)

## 2024-09-18 LAB — LIPID PANEL
Cholesterol: 146 mg/dL (ref <200)
HDL: 58 mg/dL (ref >=40)
LDL Cholesterol (Calc): 72 mg/dL
Non-HDL Cholesterol (Calc): 88 mg/dL (ref <130)
Total CHOL/HDL Ratio: 2.5 (calc) (ref <5.0)
Triglycerides: 75 mg/dL (ref <150)

## 2024-09-18 LAB — CBC WITH DIFFERENTIAL/PLATELET
Absolute Lymphocytes: 1518 {cells}/uL (ref 850–3900)
Absolute Monocytes: 512 {cells}/uL (ref 200–950)
Basophils Absolute: 30 {cells}/uL (ref 0–200)
Basophils Relative: 0.7 %
Eosinophils Absolute: 228 {cells}/uL (ref 15–500)
Eosinophils Relative: 5.3 %
HCT: 41.4 % (ref 39.4–51.1)
Hemoglobin: 13.5 g/dL (ref 13.2–17.1)
MCH: 29.9 pg (ref 27.0–33.0)
MCHC: 32.6 g/dL (ref 31.6–35.4)
MCV: 91.8 fL (ref 81.4–101.7)
MPV: 10.8 fL (ref 7.5–12.5)
Monocytes Relative: 11.9 %
Neutro Abs: 2012 {cells}/uL (ref 1500–7800)
Neutrophils Relative %: 46.8 %
Platelets: 213 Thousand/uL (ref 140–400)
RBC: 4.51 Million/uL (ref 4.20–5.80)
RDW: 12.4 % (ref 11.0–15.0)
Total Lymphocyte: 35.3 %
WBC: 4.3 Thousand/uL (ref 3.8–10.8)

## 2024-09-18 LAB — HEMOGLOBIN A1C
Hgb A1c MFr Bld: 5.7 % — ABNORMAL HIGH (ref <5.7)
Mean Plasma Glucose: 117 mg/dL
eAG (mmol/L): 6.5 mmol/L

## 2024-09-18 NOTE — Progress Notes (Signed)
 Name: Nathan Meyers   MRN: [id number]    DOB: 17-Apr-1981   Date:09/18/2024       Progress Note  Subjective  Chief Complaint  Chief Complaint  Patient presents with   Annual Exam    HPI  Patient presents for annual CPE. He also follows with the [facility].   Discussed the use of AI scribe software for clinical note transcription with the patient, who gave verbal consent to proceed.  History of Present Illness [name] is a 43 year old male with hyperlipidemia, prediabetes, and hypertension who presents for an annual physical exam.  He started rosuvastatin 5 mg about four months ago. He also takes lisinopril 20 mg and hydrochlorothiazide 25 mg daily. Home and office blood pressures are well controlled around 124/72.  Recent CT coronary calcium score was 16 in the [facility]AD. Last year's labs showed elevated and rising LDL, with triglycerides and HDL at goal.  He has prediabetes with prior A1c 5.7-5.8. A1c in June was slightly improved but still borderline. He exercises 5-6 days per week for about an hour and has lost weight since the last visit. He limits sugar and sweets. He has not eaten today. Prior fasting glucose levels were within acceptable range.  He has no concerns about STD screening. There is no family history of colon cancer. His father had prostate cancer in his fifties. He declines annual influenza vaccination because of prior adverse reactions.    IPSS     Row Name 09/18/24 9175         International Prostate Symptom Score   How often have you had the sensation of not emptying your bladder? Not at All     How often have you had to urinate less than every two hours? Not at All     How often have you found you stopped and started again several times when you urinated? Not at All     How often have you found it difficult to postpone urination? Not at All     How often have you had a weak urinary stream? Not at All     How often have you had to strain to start urination?  Not at All     How many times did you typically get up at night to urinate? None     Total IPSS Score 0       Quality of Life due to urinary symptoms   If you were to spend the rest of your life with your urinary condition just the way it is now how would you feel about that? Pleased        Diet: Regular Exercise: 6 days 60 minutes  Last Dental Exam: completed Last Eye Exam: will schedule  Depression: phq 9 is negative    09/18/2024    8:22 AM 09/18/2023    8:14 AM 09/15/2022   11:00 AM  Depression screen PHQ 2/9  Decreased Interest 0 0 0  Down, Depressed, Hopeless 0 0 0  PHQ - 2 Score 0 0 0  Altered sleeping   0  Tired, decreased energy   0  Change in appetite   0  Feeling bad or failure about yourself    0  Trouble concentrating   0  Moving slowly or fidgety/restless   0  Suicidal thoughts   0  PHQ-9 Score   0   Difficult doing work/chores   Not difficult at all     Data saved with a previous  flowsheet row definition    Hypertension:  BP Readings from Last 3 Encounters:  09/18/24 124/72  03/10/24 118/67  12/14/23 133/86   Obesity: Wt Readings from Last 3 Encounters:  09/18/24 188 lb 12.8 oz (85.6 kg)  09/18/23 194 lb 8 oz (88.2 kg)  09/15/22 187 lb 12.8 oz (85.2 kg)   BMI Readings from Last 3 Encounters:  09/18/24 25.61 kg/m  09/18/23 26.38 kg/m  09/15/22 25.47 kg/m     [employer] Visit from 09/18/2024 in [facility]  AUDIT-C Score 2    Married STD testing and prevention (HIV/chl/gon/syphilis):  no Sexual history: active Hep C Screening: getting today Skin cancer: Discussed monitoring for atypical lesions Colorectal cancer: NA, discussed starting screening at age 63 Prostate cancer:  not applicable No results found for: PSA - history of prostate cancer in his father in his 50's, plan to start screening around age 64   Lung cancer:  Low Dose CT Chest recommended if Age 35-80 years, 30 pack-year currently  smoking OR have quit w/in 15years. Patient  is not a candidate for screening   AAA: The USPSTF recommends one-time screening with ultrasonography in men ages 7 to 75 years who have ever smoked. Patient   is not a candidate for screening  ECG:  NA  Vaccines: reviewed with the patient. Declines flu vaccine.   Advanced Care Planning: A voluntary discussion about advance care planning including the explanation and discussion of advance directives.  Discussed health care proxy and Living will, and the patient was able to identify a health care proxy .  Patient does not have a living will and power of attorney of health care   Patient Active Problem List   Diagnosis Date Noted   Prediabetes 09/15/2022   High blood pressure 03/14/2003    No past surgical history on file.  Family History  Problem Relation Age of Onset   Diabetes Mother    Hypertension Mother    Heart disease Father    Hypertension Father     Social History   Socioeconomic History   Marital status: Married    Spouse name: Not on file   Number of children: 2   Years of education: Not on file   Highest education level: Some college, no degree  Occupational History   Not on file  Tobacco Use   Smoking status: Former    Current packs/day: 0.00    Average packs/day: 0.5 packs/day for 3.0 years (1.5 ttl pk-yrs)    Types: Cigarettes    Start date: 01/26/2011    Quit date: 01/25/2014    Years since quitting: 10.6   Smokeless tobacco: Never  Vaping Use   Vaping status: Never Used  Substance and Sexual Activity   Alcohol use: Yes    Alcohol/week: 4.0 standard drinks of alcohol    Types: 4 Cans of beer per week   Drug use: Never   Sexual activity: Yes    Birth control/protection: None  Other Topics Concern   Not on file  Social History Narrative   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[place], [place]   Social Drivers of Health   Financial Resource Strain: Low Risk  (09/17/2024)   Overall Financial Resource Strain (CARDIA)     Difficulty of Paying Living Expenses: Not very hard  Food Insecurity: No Food Insecurity (09/18/2024)   Hunger Vital Sign    Worried About Running Out of Food in the Last Year: Never true    Ran Out of Food  in the Last Year: Never true  Transportation Needs: No Transportation Needs (09/17/2024)   PRAPARE - Administrator, Civil Service (Medical): No    Lack of Transportation (Non-Medical): No  Physical Activity: Sufficiently Active (09/17/2024)   Exercise Vital Sign    Days of Exercise per Week: 5 days    Minutes of Exercise per Session: 60 min  Stress: No Stress Concern Present (09/17/2024)   [employer] of Occupational Health - Occupational Stress Questionnaire    Feeling of Stress: Only a little  Social Connections: Moderately Integrated (09/17/2024)   Social Connection and Isolation Panel    Frequency of Communication with Friends and Family: More than three times a week    Frequency of Social Gatherings with Friends and Family: Once a week    Attends Religious Services: More than 4 times per year    Active Member of [employer] or Organizations: Yes    Attends Engineer, Structural: More than 4 times per year    Marital Status: Divorced  Intimate Partner Violence: Not At Risk (09/18/2024)   Humiliation, Afraid, Rape, and Kick questionnaire    Fear of Current or Ex-Partner: No    Emotionally Abused: No    Physically Abused: No    Sexually Abused: No     Current Outpatient Medications:    lisinopril-hydrochlorothiazide (ZESTORETIC) 20-25 MG tablet, Take 1 tablet by mouth daily., Disp: , Rfl:    methocarbamol  (ROBAXIN ) 500 MG tablet, Take 1 tablet (500 mg total) by mouth 2 (two) times daily as needed for muscle spasms., Disp: 10 tablet, Rfl: 0   rosuvastatin (CRESTOR) 5 MG tablet, Take 5 mg by mouth., Disp: , Rfl:    sildenafil (VIAGRA) 50 MG tablet, Take 25 mg by mouth., Disp: , Rfl:   No Known Allergies   Review of Systems  All other systems reviewed and  are negative.    Objective  Vitals:   09/18/24 0820  BP: 124/72  Pulse: 67  Resp: 16  Temp: 98.2 F (36.8 C)  TempSrc: Oral  SpO2: 99%  Weight: 188 lb 12.8 oz (85.6 kg)  Height: 6' (1.829 m)    Body mass index is 25.61 kg/m.  Physical Exam Constitutional:      Appearance: Normal appearance.  HENT:     Head: Normocephalic and atraumatic.     Mouth/Throat:     Mouth: Mucous membranes are moist.     Pharynx: Oropharynx is clear.  Eyes:     Extraocular Movements: Extraocular movements intact.     Conjunctiva/sclera: Conjunctivae normal.     Pupils: Pupils are equal, round, and reactive to light.  Neck:     Comments: No thyromegaly Cardiovascular:     Rate and Rhythm: Normal rate and regular rhythm.  Pulmonary:     Effort: Pulmonary effort is normal.     Breath sounds: Normal breath sounds.  Musculoskeletal:     Cervical back: No tenderness.     Right lower leg: No edema.     Left lower leg: No edema.  Lymphadenopathy:     Cervical: No cervical adenopathy.  Skin:    General: Skin is warm and dry.  Neurological:     General: No focal deficit present.     Mental Status: He is alert. Mental status is at baseline.  Psychiatric:        Mood and Affect: Mood normal.        Behavior: Behavior normal.     Last CBC Lab Results  Component Value Date   WBC 3.6 (L) 09/18/2023   HGB 14.6 09/18/2023   HCT 44.9 09/18/2023   MCV 90.9 09/18/2023   MCH 29.6 09/18/2023   RDW 12.3 09/18/2023   PLT 208 09/18/2023   Last metabolic panel Lab Results  Component Value Date   GLUCOSE 94 09/18/2023   NA 137 09/18/2023   K 4.3 09/18/2023   CL 98 09/18/2023   CO2 32 09/18/2023   BUN 24 09/18/2023   CREATININE 1.43 (H) 09/18/2023   EGFR 63 09/18/2023   CALCIUM 9.6 09/18/2023   PROT 7.3 09/18/2023   BILITOT 0.9 09/18/2023   AST 19 09/18/2023   ALT 22 09/18/2023   Last lipids Lab Results  Component Value Date   CHOL 246 (H) 09/18/2023   HDL 50 09/18/2023    LDLCALC 170 (H) 09/18/2023   TRIG 123 09/18/2023   CHOLHDL 4.9 09/18/2023   Last hemoglobin A1c Lab Results  Component Value Date   HGBA1C 5.8 (H) 09/18/2023   Last thyroid functions No results found for: TSH, T3TOTAL, T4TOTAL, FREET4, THYROIDAB Last vitamin D No results found for: 25OHVITD2, 25OHVITD3, VD25OH Last vitamin B12 and Folate No results found for: VITAMINB12, FOLATE    Assessment & Plan  Assessment & Plan Hyperlipidemia LDL elevated with coronary plaque. Triglycerides and HDL controlled. Crestor initiated for cardiovascular risk reduction. - Continue Crestor 5 mg daily. - Rechecked cholesterol levels today.  Prediabetes A1c in lower prediabetic range. Regular exercise beneficial for glucose control. - Rechecked A1c today. - Continue regular exercise regimen.  Hypertension Blood pressure controlled on current medication regimen. - Continue lisinopril 20 mg daily. - Continue hydrochlorothiazide 25 mg daily.  Annual Physical/General Health Maintenance Discussed regular screenings and vaccinations. Family history of prostate cancer noted. - Will schedule colon cancer screening at age 35. - Will schedule prostate cancer screening at age 4. - Offered flu vaccine.  - HgB A1c - CBC w/Diff/Platelet - Comprehensive Metabolic Panel (CMET)   -Prostate cancer screening and PSA options (with potential risks and benefits of testing vs not testing) were discussed along with recent recs/guidelines. -USPSTF grade A and B recommendations reviewed with patient; age-appropriate recommendations, preventive care, screening tests, etc discussed and encouraged; healthy living encouraged; see AVS for patient education given to patient -Discussed importance of 150 minutes of physical activity weekly, eat two servings of fish weekly, eat one serving of tree nuts ( cashews, pistachios, pecans, almonds.[email or web address]) every other day, eat 6 servings of fruit/vegetables daily and  drink plenty of water and avoid sweet beverages.  -Reviewed Health Maintenance: yes

## 2024-09-19 ENCOUNTER — Ambulatory Visit: Payer: Self-pay | Admitting: Internal Medicine

## 2025-09-19 ENCOUNTER — Ambulatory Visit: Admitting: Internal Medicine
